# Patient Record
Sex: Female | Born: 1940 | Race: White | Hispanic: No | Smoking: Former smoker
Health system: Southern US, Community
[De-identification: ages and names within clinical notes are randomized; demographics above are authoritative.]

## PROBLEM LIST (undated history)

## (undated) DIAGNOSIS — M1990 Unspecified osteoarthritis, unspecified site: Secondary | ICD-10-CM

## (undated) DIAGNOSIS — C140 Malignant neoplasm of pharynx, unspecified: Secondary | ICD-10-CM

## (undated) DIAGNOSIS — J40 Bronchitis, not specified as acute or chronic: Secondary | ICD-10-CM

## (undated) HISTORY — PX: ABDOMINAL HYSTERECTOMY: SHX81

## (undated) HISTORY — PX: THROAT SURGERY: SHX803

---

## 1998-06-12 ENCOUNTER — Other Ambulatory Visit: Admission: RE | Admit: 1998-06-12 | Discharge: 1998-06-12 | Payer: Self-pay | Admitting: Otolaryngology

## 2011-03-21 ENCOUNTER — Emergency Department (INDEPENDENT_AMBULATORY_CARE_PROVIDER_SITE_OTHER): Payer: Medicare Other

## 2011-03-21 ENCOUNTER — Emergency Department (HOSPITAL_BASED_OUTPATIENT_CLINIC_OR_DEPARTMENT_OTHER)
Admission: EM | Admit: 2011-03-21 | Discharge: 2011-03-22 | Disposition: A | Discharge status: Home / Self Care | Payer: Medicare Other | Attending: Emergency Medicine | Admitting: Emergency Medicine

## 2011-03-21 ENCOUNTER — Encounter: Payer: Self-pay | Admitting: *Deleted

## 2011-03-21 DIAGNOSIS — Z79899 Other long term (current) drug therapy: Secondary | ICD-10-CM | POA: Insufficient documentation

## 2011-03-21 DIAGNOSIS — Z85819 Personal history of malignant neoplasm of unspecified site of lip, oral cavity, and pharynx: Secondary | ICD-10-CM

## 2011-03-21 DIAGNOSIS — J45909 Unspecified asthma, uncomplicated: Secondary | ICD-10-CM | POA: Insufficient documentation

## 2011-03-21 DIAGNOSIS — R0602 Shortness of breath: Secondary | ICD-10-CM | POA: Insufficient documentation

## 2011-03-21 DIAGNOSIS — E119 Type 2 diabetes mellitus without complications: Secondary | ICD-10-CM | POA: Insufficient documentation

## 2011-03-21 DIAGNOSIS — J40 Bronchitis, not specified as acute or chronic: Secondary | ICD-10-CM

## 2011-03-21 DIAGNOSIS — Z87891 Personal history of nicotine dependence: Secondary | ICD-10-CM

## 2011-03-21 DIAGNOSIS — R05 Cough: Secondary | ICD-10-CM

## 2011-03-21 HISTORY — DX: Bronchitis, not specified as acute or chronic: J40

## 2011-03-21 HISTORY — DX: Malignant neoplasm of pharynx, unspecified: C14.0

## 2011-03-21 MED ORDER — AZITHROMYCIN 250 MG PO TABS: 250.0000 mg | ORAL_TABLET | Freq: Every day | ORAL | Status: AC

## 2011-03-21 MED ORDER — ALBUTEROL SULFATE HFA 108 (90 BASE) MCG/ACT IN AERS
2.0000 | INHALATION_SPRAY | RESPIRATORY_TRACT | Status: DC | PRN
  Administered 2011-03-21: 2 via RESPIRATORY_TRACT
  Filled 2011-03-21: qty 6.7

## 2011-03-21 MED ORDER — AZITHROMYCIN 250 MG PO TABS
500.0000 mg | ORAL_TABLET | Freq: Once | ORAL | Status: AC
  Administered 2011-03-21: 500 mg via ORAL
  Filled 2011-03-21: qty 2

## 2011-03-21 NOTE — ED Notes (Signed)
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 Pt uses a speaking device to aid in speech. Pt states that she was seen by her MD recently for similar symptoms but states that nothing was done and she was at a meeting tonight and began coughing non stop as came to ED.

## 2011-03-21 NOTE — ED Notes (Signed)
D/c home with rx x 1 for zithromax- inhaler given to pt with instructions for use by RT

## 2011-03-21 NOTE — ED Notes (Signed)
Pt reports cough and sob x 2 weeks- saw pcp last week but no tests were done- states sx are worsening

## 2011-03-21 NOTE — ED Provider Notes (Signed)
History     CSN: [id number] Arrival date & time: 03/21/2011  9:55 PM   First MD Initiated Contact with Patient 03/21/11 2258      Chief Complaint  Patient presents with  . Cough  . Shortness of Breath    (Consider location/radiation/quality/duration/timing/severity/associated sxs/prior treatment) HPI Complains of cough productive for 2 weeks yellow sputum and  mild shortness of breath, no fever no pain, no chills no other complaint no treatment prior to coming here. Saw her primary care Dr. several days ago for same complaint. No treatment rendered. no other associated symptoms. Presents tonight as she had a bad coughing spell this evening. Past Medical History  Diagnosis Date  . Asthma   . Throat cancer   . Bronchitis   . Diabetes mellitus     Past Surgical History  Procedure Date  . Throat surgery   . Abdominal hysterectomy    tracheostomy  No family history on file.  History  Substance Use Topics  . Smoking status: Former Games developer  . Smokeless tobacco: Not on file  . Alcohol Use: No   no drug use  OB History    Grav Para Term Preterm Abortions TAB SAB Ect Mult Living                  Review of Systems  Constitutional: Negative.   HENT: Negative.   Respiratory: Positive for cough and shortness of breath.   Cardiovascular: Negative.   Gastrointestinal: Negative.   Musculoskeletal: Negative.   Skin: Negative.   Neurological: Negative.   Hematological: Negative.   Psychiatric/Behavioral: Negative.   All other systems reviewed and are negative.    Allergies  Gluten and Penicillins  Home Medications   Current Outpatient Rx  Name Route Sig Dispense Refill  . ACAI PO Oral Take 1 tablet by mouth daily.      Marland Kitchen ALFALFA PO Oral Take 1 capsule by mouth daily.      Marland Kitchen VITAMIN C PO Oral Take 2 tablets by mouth daily.      . CHLORTHALIDONE 25 MG PO TABS Oral Take 25 mg by mouth daily.      Marland Kitchen CITALOPRAM HYDROBROMIDE 40 MG PO TABS Oral Take 40 mg by mouth daily.       . GLYBURIDE-METFORMIN 5-500 MG PO TABS Oral Take 1 tablet by mouth 2 (two) times daily.      Marland Kitchen LISINOPRIL 5 MG PO TABS Oral Take 5 mg by mouth every morning.      Marland Kitchen METFORMIN HCL 500 MG PO TABS Oral Take 500 mg by mouth 2 (two) times daily.      Marland Kitchen OMEPRAZOLE 20 MG PO CPDR Oral Take 20 mg by mouth daily.      Marland Kitchen OVER THE COUNTER MEDICATION Oral Take 1 tablet by mouth daily. Vitamin C and D combination     . SIMVASTATIN 40 MG PO TABS Oral Take 40 mg by mouth at bedtime.        BP 126/66  Pulse 74  Temp(Src) 98.4 F (36.9 C) (Oral)  Resp 18  SpO2 96%  Physical Exam  Nursing note and vitals reviewed. Constitutional: She appears well-developed and well-nourished.  HENT:  Head: Normocephalic and atraumatic.  Eyes: Conjunctivae are normal. Pupils are equal, round, and reactive to light.  Neck: Neck supple. No tracheal deviation present. No thyromegaly present.       Tracheostomy  Cardiovascular: Normal rate and regular rhythm.   No murmur heard. Pulmonary/Chest: Effort normal. She has wheezes.  No respiratory distress mild end expiratory wheezes  Abdominal: Soft. Bowel sounds are normal. She exhibits no distension. There is no tenderness.  Musculoskeletal: Normal range of motion. She exhibits no edema and no tenderness.  Neurological: She is alert. Coordination normal.  Skin: Skin is warm and dry. No rash noted.  Psychiatric: She has a normal mood and affect.    ED Course  Procedures (including critical care time)  Labs Reviewed - No data to display Dg Chest 2 View  03/21/2011  *RADIOLOGY REPORT*  Clinical Data: Productive cough and shortness of breath; history of throat cancer and smoking.  CHEST - 2 VIEW  Comparison: None.  Findings: The lungs are well-aerated.  Mild right basilar airspace opacity is not visualized on the lateral view, and most likely reflects atelectasis.  There is no evidence of pleural effusion or pneumothorax.  There is mild apparent chronic narrowing  of the trachea, without evidence of compression.  The heart is normal in size; the mediastinal contour is within normal limits.  No acute osseous abnormalities are seen.  IMPRESSION:  1.  Mild right basilar airspace opacity, not visualized on the lateral view, most likely reflects atelectasis. 2.  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  Original Report Authenticated By: Tonia Ghent, M.D.     No diagnosis found.  Results for orders placed during the hospital encounter of 03/21/11  GLUCOSE, CAPILLARY      Component Value Range   Glucose-Capillary 130 (*) 70 - 99 (mg/dL)   Comment 1 Notify RN     Comment 2 Documented in Chart     Dg Chest 2 View  03/21/2011  *RADIOLOGY REPORT*  Clinical Data: Productive cough and shortness of breath; history of throat cancer and smoking.  CHEST - 2 VIEW  Comparison: None.  Findings: The lungs are well-aerated.  Mild right basilar airspace opacity is not visualized on the lateral view, and most likely reflects atelectasis.  There is no evidence of pleural effusion or pneumothorax.  There is mild apparent chronic narrowing of the trachea, without evidence of compression.  The heart is normal in size; the mediastinal contour is within normal limits.  No acute osseous abnormalities are seen.  IMPRESSION:  1.  Mild right basilar airspace opacity, not visualized on the lateral view, most likely reflects atelectasis. 2.  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  Original Report Authenticated By: Tonia Ghent, M.D.     MDM  cbg =130, normal    Asssesment:in light of sx lasting 2 qweeks will rx zithmax and albuterol hfa Pt to keep appt w/ pmd in 2 days Diagnosis : bronchitis      Doug Sou, MD 03/21/11 2320

## 2011-03-21 NOTE — ED Notes (Signed)
I reported to Dr. Rennis Chris that the blood sugar is 130.

## 2011-03-28 ENCOUNTER — Encounter (HOSPITAL_BASED_OUTPATIENT_CLINIC_OR_DEPARTMENT_OTHER): Payer: Self-pay | Admitting: *Deleted

## 2012-09-15 ENCOUNTER — Emergency Department (HOSPITAL_BASED_OUTPATIENT_CLINIC_OR_DEPARTMENT_OTHER): Payer: Medicare Other

## 2012-09-15 ENCOUNTER — Emergency Department (HOSPITAL_BASED_OUTPATIENT_CLINIC_OR_DEPARTMENT_OTHER)
Admission: EM | Admit: 2012-09-15 | Discharge: 2012-09-15 | Disposition: A | Discharge status: Home Health | Payer: Medicare Other | Attending: Emergency Medicine | Admitting: Emergency Medicine

## 2012-09-15 ENCOUNTER — Encounter (HOSPITAL_BASED_OUTPATIENT_CLINIC_OR_DEPARTMENT_OTHER): Payer: Self-pay | Admitting: *Deleted

## 2012-09-15 DIAGNOSIS — S8000XA Contusion of unspecified knee, initial encounter: Secondary | ICD-10-CM | POA: Insufficient documentation

## 2012-09-15 DIAGNOSIS — Z87891 Personal history of nicotine dependence: Secondary | ICD-10-CM | POA: Insufficient documentation

## 2012-09-15 DIAGNOSIS — S8001XA Contusion of right knee, initial encounter: Secondary | ICD-10-CM

## 2012-09-15 DIAGNOSIS — S0990XA Unspecified injury of head, initial encounter: Secondary | ICD-10-CM | POA: Insufficient documentation

## 2012-09-15 DIAGNOSIS — J45909 Unspecified asthma, uncomplicated: Secondary | ICD-10-CM | POA: Insufficient documentation

## 2012-09-15 DIAGNOSIS — Y9301 Activity, walking, marching and hiking: Secondary | ICD-10-CM | POA: Insufficient documentation

## 2012-09-15 DIAGNOSIS — Y92009 Unspecified place in unspecified non-institutional (private) residence as the place of occurrence of the external cause: Secondary | ICD-10-CM | POA: Insufficient documentation

## 2012-09-15 DIAGNOSIS — S60229A Contusion of unspecified hand, initial encounter: Secondary | ICD-10-CM | POA: Insufficient documentation

## 2012-09-15 DIAGNOSIS — Z880 Allergy status to penicillin: Secondary | ICD-10-CM | POA: Insufficient documentation

## 2012-09-15 DIAGNOSIS — Z79899 Other long term (current) drug therapy: Secondary | ICD-10-CM | POA: Insufficient documentation

## 2012-09-15 DIAGNOSIS — E119 Type 2 diabetes mellitus without complications: Secondary | ICD-10-CM | POA: Insufficient documentation

## 2012-09-15 DIAGNOSIS — S60221A Contusion of right hand, initial encounter: Secondary | ICD-10-CM

## 2012-09-15 DIAGNOSIS — Z85819 Personal history of malignant neoplasm of unspecified site of lip, oral cavity, and pharynx: Secondary | ICD-10-CM | POA: Insufficient documentation

## 2012-09-15 DIAGNOSIS — S1093XA Contusion of unspecified part of neck, initial encounter: Secondary | ICD-10-CM | POA: Insufficient documentation

## 2012-09-15 DIAGNOSIS — S0083XA Contusion of other part of head, initial encounter: Secondary | ICD-10-CM

## 2012-09-15 DIAGNOSIS — S0530XA Ocular laceration without prolapse or loss of intraocular tissue, unspecified eye, initial encounter: Secondary | ICD-10-CM | POA: Insufficient documentation

## 2012-09-15 DIAGNOSIS — S022XXA Fracture of nasal bones, initial encounter for closed fracture: Secondary | ICD-10-CM | POA: Insufficient documentation

## 2012-09-15 DIAGNOSIS — W19XXXA Unspecified fall, initial encounter: Secondary | ICD-10-CM

## 2012-09-15 DIAGNOSIS — W108XXA Fall (on) (from) other stairs and steps, initial encounter: Secondary | ICD-10-CM | POA: Insufficient documentation

## 2012-09-15 DIAGNOSIS — S0003XA Contusion of scalp, initial encounter: Secondary | ICD-10-CM | POA: Insufficient documentation

## 2012-09-15 MED ORDER — HYDROCODONE-ACETAMINOPHEN 5-325 MG PO TABS: 2.0000 | ORAL_TABLET | ORAL | Status: DC | PRN

## 2012-09-15 NOTE — ED Notes (Signed)
Pt states that her shoe caught on carpet and she tripped fell down 2 steps no loss of consciousness complains of pain bridge of nose some dried blood noted around nose but no bleeding noted from the nose. Pt states she is having pain in her right hip,right knee,and right leg,right ankle. No deformity noted of any mentioned.

## 2012-09-15 NOTE — ED Provider Notes (Signed)
History     CSN: [id number]  Arrival date & time 09/15/12  1800   First MD Initiated Contact with Patient 09/15/12 1801      Chief Complaint  Patient presents with  . Fall    shoe caught on floor tripped fell down 2 stairs landed on face nose pain right hip pain right hand pain right knee pain    (Consider location/radiation/quality/duration/timing/severity/associated sxs/prior treatment) HPI Comments: Patient was walking in and out of the house to bring in groceries when she fell down the last two steps and landed on the sidewalk.  She has swelling to the right eye and an abrasion / small laceration in the corner of the eye.  She denies loc, headache, chest pian, shortness of breath.  She complains of headache, face pain, right knee pain, and left hand and wrist pain.    Patient is a 72 y.o. female presenting with fall. The history is provided by the patient.  Fall This is a new problem. The current episode started less than 1 hour ago. The problem occurs constantly. Associated symptoms include headaches. Pertinent negatives include no abdominal pain and no shortness of breath. Nothing aggravates the symptoms. Nothing relieves the symptoms. She has tried nothing for the symptoms.    Past Medical History  Diagnosis Date  . Asthma   . Throat cancer   . Bronchitis   . Diabetes mellitus     Past Surgical History  Procedure Laterality Date  . Throat surgery    . Abdominal hysterectomy      History reviewed. No pertinent family history.  History  Substance Use Topics  . Smoking status: Former Games developer  . Smokeless tobacco: Not on file  . Alcohol Use: No    OB History   Grav Para Term Preterm Abortions TAB SAB Ect Mult Living                  Review of Systems  Respiratory: Negative for shortness of breath.   Gastrointestinal: Negative for abdominal pain.  Neurological: Positive for headaches.  All other systems reviewed and are negative.    Allergies  Gluten and  Penicillins  Home Medications   Current Outpatient Rx  Name  Route  Sig  Dispense  Refill  . metFORMIN (GLUCOPHAGE) 1000 MG tablet   Oral   Take 1,000 mg by mouth 2 (two) times daily with a meal.         . ACAI PO   Oral   Take 1 tablet by mouth daily.           Marland Kitchen ALFALFA PO   Oral   Take 1 capsule by mouth daily.           . Ascorbic Acid (VITAMIN C PO)   Oral   Take 2 tablets by mouth daily.           . chlorthalidone (HYGROTON) 25 MG tablet   Oral   Take 25 mg by mouth daily.           . citalopram (CELEXA) 40 MG tablet   Oral   Take 40 mg by mouth daily.           Marland Kitchen glyBURIDE-metformin (GLUCOVANCE) 5-500 MG per tablet   Oral   Take 1 tablet by mouth 2 (two) times daily.           Marland Kitchen lisinopril (PRINIVIL,ZESTRIL) 5 MG tablet   Oral   Take 5 mg by mouth every morning.           Marland Kitchen  metFORMIN (GLUCOPHAGE) 500 MG tablet   Oral   Take 500 mg by mouth 2 (two) times daily.           Marland Kitchen omeprazole (PRILOSEC) 20 MG capsule   Oral   Take 20 mg by mouth daily.           Marland Kitchen OVER THE COUNTER MEDICATION   Oral   Take 1 tablet by mouth daily. Vitamin C and D combination          . simvastatin (ZOCOR) 40 MG tablet   Oral   Take 40 mg by mouth at bedtime.             BP 121/76  Pulse 76  Temp(Src) 98.3 F (36.8 C) (Oral)  Resp 20  SpO2 93%  Physical Exam  Nursing note and vitals reviewed. Constitutional: She is oriented to person, place, and time. She appears well-developed and well-nourished. No distress.  HENT:  Head: Normocephalic.  Mouth/Throat: Oropharynx is clear and moist.  There is a small laceration, abrasion to the corner of the left eye and bridge of the nose.  It measures less than 0.5 cm.    Eyes: EOM are normal. Pupils are equal, round, and reactive to light.  There is no diplopia with upward gaze.  Neck: Normal range of motion. Neck supple.  Cardiovascular: Normal rate and regular rhythm.   No murmur  heard. Pulmonary/Chest: Effort normal and breath sounds normal. No respiratory distress. She has no wheezes.  Abdominal: Soft. Bowel sounds are normal. She exhibits no distension. There is no tenderness.  Musculoskeletal: Normal range of motion. She exhibits no edema.  Lymphadenopathy:    She has no cervical adenopathy.  Neurological: She is alert and oriented to person, place, and time. No cranial nerve deficit. She exhibits normal muscle tone. Coordination normal.  Skin: Skin is warm and dry. She is not diaphoretic.    ED Course  Procedures (including critical care time)  Labs Reviewed - No data to display No results found.   No diagnosis found.    MDM  The imaging studies reveal only a nasal bone fracture that does not appear to require any sort of intervention.  She will be discharged with pain meds, ice to affected areas, return prn.        Geoffery Lyons, MD 09/15/12 2005

## 2014-02-28 ENCOUNTER — Emergency Department (HOSPITAL_BASED_OUTPATIENT_CLINIC_OR_DEPARTMENT_OTHER)
Admission: EM | Admit: 2014-02-28 | Discharge: 2014-02-28 | Disposition: A | Discharge status: Home / Self Care | Payer: Medicare Other | Attending: Emergency Medicine | Admitting: Emergency Medicine

## 2014-02-28 ENCOUNTER — Emergency Department (HOSPITAL_BASED_OUTPATIENT_CLINIC_OR_DEPARTMENT_OTHER): Payer: Medicare Other

## 2014-02-28 ENCOUNTER — Encounter (HOSPITAL_BASED_OUTPATIENT_CLINIC_OR_DEPARTMENT_OTHER): Payer: Self-pay

## 2014-02-28 DIAGNOSIS — J45909 Unspecified asthma, uncomplicated: Secondary | ICD-10-CM | POA: Insufficient documentation

## 2014-02-28 DIAGNOSIS — E119 Type 2 diabetes mellitus without complications: Secondary | ICD-10-CM | POA: Insufficient documentation

## 2014-02-28 DIAGNOSIS — Z880 Allergy status to penicillin: Secondary | ICD-10-CM | POA: Insufficient documentation

## 2014-02-28 DIAGNOSIS — Z85818 Personal history of malignant neoplasm of other sites of lip, oral cavity, and pharynx: Secondary | ICD-10-CM | POA: Insufficient documentation

## 2014-02-28 DIAGNOSIS — R109 Unspecified abdominal pain: Secondary | ICD-10-CM | POA: Diagnosis present

## 2014-02-28 DIAGNOSIS — Z90710 Acquired absence of both cervix and uterus: Secondary | ICD-10-CM | POA: Insufficient documentation

## 2014-02-28 DIAGNOSIS — K5792 Diverticulitis of intestine, part unspecified, without perforation or abscess without bleeding: Secondary | ICD-10-CM | POA: Diagnosis not present

## 2014-02-28 DIAGNOSIS — Z87891 Personal history of nicotine dependence: Secondary | ICD-10-CM | POA: Diagnosis not present

## 2014-02-28 DIAGNOSIS — Z79899 Other long term (current) drug therapy: Secondary | ICD-10-CM | POA: Insufficient documentation

## 2014-02-28 LAB — COMPREHENSIVE METABOLIC PANEL
ALBUMIN: 3.4 g/dL — AB (ref 3.5–5.2)
ALK PHOS: 68 U/L (ref 39–117)
ALT: 9 U/L (ref 0–35)
ANION GAP: 12 (ref 5–15)
AST: 19 U/L (ref 0–37)
BUN: 11 mg/dL (ref 6–23)
CO2: 25 mEq/L (ref 19–32)
Calcium: 9 mg/dL (ref 8.4–10.5)
Chloride: 102 mEq/L (ref 96–112)
Creatinine, Ser: 0.9 mg/dL (ref 0.50–1.10)
GFR calc Af Amer: 72 mL/min — ABNORMAL LOW (ref >90)
GFR calc non Af Amer: 62 mL/min — ABNORMAL LOW (ref >90)
Glucose, Bld: 141 mg/dL — ABNORMAL HIGH (ref 70–99)
POTASSIUM: 4 meq/L (ref 3.7–5.3)
SODIUM: 139 meq/L (ref 137–147)
TOTAL PROTEIN: 7 g/dL (ref 6.0–8.3)
Total Bilirubin: 0.4 mg/dL (ref 0.3–1.2)

## 2014-02-28 LAB — URINE MICROSCOPIC-ADD ON

## 2014-02-28 LAB — CBC WITH DIFFERENTIAL/PLATELET
Basophils Absolute: 0 10*3/uL (ref 0.0–0.1)
Basophils Relative: 0 % (ref 0–1)
Eosinophils Absolute: 0.2 10*3/uL (ref 0.0–0.7)
Eosinophils Relative: 2 % (ref 0–5)
HCT: 31.8 % — ABNORMAL LOW (ref 36.0–46.0)
HEMOGLOBIN: 10.1 g/dL — AB (ref 12.0–15.0)
LYMPHS ABS: 1 10*3/uL (ref 0.7–4.0)
Lymphocytes Relative: 12 % (ref 12–46)
MCH: 33 pg (ref 26.0–34.0)
MCHC: 31.8 g/dL (ref 30.0–36.0)
MCV: 103.9 fL — ABNORMAL HIGH (ref 78.0–100.0)
MONOS PCT: 10 % (ref 3–12)
Monocytes Absolute: 0.8 10*3/uL (ref 0.1–1.0)
NEUTROS ABS: 5.8 10*3/uL (ref 1.7–7.7)
NEUTROS PCT: 76 % (ref 43–77)
Platelets: 290 10*3/uL (ref 150–400)
RBC: 3.06 MIL/uL — AB (ref 3.87–5.11)
RDW: 13.5 % (ref 11.5–15.5)
WBC: 7.8 10*3/uL (ref 4.0–10.5)

## 2014-02-28 LAB — URINALYSIS, ROUTINE W REFLEX MICROSCOPIC
BILIRUBIN URINE: NEGATIVE
GLUCOSE, UA: NEGATIVE mg/dL
Hgb urine dipstick: NEGATIVE
Ketones, ur: NEGATIVE mg/dL
NITRITE: NEGATIVE
PH: 5.5 (ref 5.0–8.0)
Protein, ur: NEGATIVE mg/dL
SPECIFIC GRAVITY, URINE: 1.01 (ref 1.005–1.030)
Urobilinogen, UA: 0.2 mg/dL (ref 0.0–1.0)

## 2014-02-28 MED ORDER — METRONIDAZOLE 500 MG PO TABS: 500.0000 mg | ORAL_TABLET | Freq: Three times a day (TID) | ORAL | Status: AC

## 2014-02-28 MED ORDER — MORPHINE SULFATE 4 MG/ML IJ SOLN
4.0000 mg | Freq: Once | INTRAMUSCULAR | Status: AC
  Administered 2014-02-28: 4 mg via INTRAVENOUS
  Filled 2014-02-28: qty 1

## 2014-02-28 MED ORDER — CIPROFLOXACIN HCL 500 MG PO TABS: 500.0000 mg | ORAL_TABLET | Freq: Two times a day (BID) | ORAL | Status: AC

## 2014-02-28 MED ORDER — ONDANSETRON HCL 4 MG/2ML IJ SOLN
4.0000 mg | Freq: Once | INTRAMUSCULAR | Status: AC
  Administered 2014-02-28: 4 mg via INTRAVENOUS
  Filled 2014-02-28: qty 2

## 2014-02-28 MED ORDER — HYDROCODONE-ACETAMINOPHEN 5-325 MG PO TABS: 1.0000 | ORAL_TABLET | Freq: Four times a day (QID) | ORAL | Status: AC | PRN

## 2014-02-28 NOTE — ED Notes (Signed)
Pt c/o lt flank pain since midnight with mid vomiting; denies urinary difficulties

## 2014-02-28 NOTE — ED Provider Notes (Signed)
CSN: [id number]     Arrival date & time 02/28/14  0549 History   First MD Initiated Contact with Patient 02/28/14 0601     Chief Complaint  Patient presents with  . Flank Pain     (Consider location/radiation/quality/duration/timing/severity/associated sxs/prior Treatment) HPI Comments: Patient is a 73 year old female with history of diabetes, hypertension, throat cancer with laryngectomy. She presents with complaints of left flank and left sided abdominal pain which started at 12 midnight. She denies any fevers or chills. She does admit to some nausea and vomiting. She denies any constipation, diarrhea, or bloody stool. She denies any urinary complaints. She has history of hysterectomy in the past and tells me her most recent colonoscopy was one year ago and was normal.  Patient is a 73 y.o. female presenting with flank pain. The history is provided by the patient.  Flank Pain This is a new problem. Episode onset: 6 hours ago. The problem occurs constantly. The problem has been rapidly worsening. Associated symptoms include abdominal pain. Nothing aggravates the symptoms. Nothing relieves the symptoms. She has tried nothing for the symptoms. The treatment provided no relief.    Past Medical History  Diagnosis Date  . Asthma   . Throat cancer   . Bronchitis   . Diabetes mellitus    Past Surgical History  Procedure Laterality Date  . Throat surgery    . Abdominal hysterectomy     No family history on file. History  Substance Use Topics  . Smoking status: Former Research scientist (life sciences)  . Smokeless tobacco: Not on file  . Alcohol Use: No   OB History    No data available     Review of Systems  Gastrointestinal: Positive for abdominal pain.  Genitourinary: Positive for flank pain.  All other systems reviewed and are negative.     Allergies  Gluten and Penicillins  Home Medications   Prior to Admission medications   Medication Sig Start Date End Date Taking? Authorizing Provider   ACAI PO Take 1 tablet by mouth daily.      Historical Provider, MD  ALFALFA PO Take 1 capsule by mouth daily.      Historical Provider, MD  Ascorbic Acid (VITAMIN C PO) Take 2 tablets by mouth daily.      Historical Provider, MD  chlorthalidone (HYGROTON) 25 MG tablet Take 25 mg by mouth daily.      Historical Provider, MD  citalopram (CELEXA) 40 MG tablet Take 40 mg by mouth daily.      Historical Provider, MD  glyBURIDE-metformin (GLUCOVANCE) 5-500 MG per tablet Take 1 tablet by mouth 2 (two) times daily.      Historical Provider, MD  HYDROcodone-acetaminophen (NORCO/VICODIN) 5-325 MG per tablet Take 2 tablets by mouth every 4 (four) hours as needed for pain. 09/15/12   Veryl Speak, MD  lisinopril (PRINIVIL,ZESTRIL) 5 MG tablet Take 5 mg by mouth every morning.      Historical Provider, MD  metFORMIN (GLUCOPHAGE) 1000 MG tablet Take 1,000 mg by mouth 2 (two) times daily with a meal.    Historical Provider, MD  metFORMIN (GLUCOPHAGE) 500 MG tablet Take 500 mg by mouth 2 (two) times daily.      Historical Provider, MD  omeprazole (PRILOSEC) 20 MG capsule Take 20 mg by mouth daily.      Historical Provider, MD  OVER THE COUNTER MEDICATION Take 1 tablet by mouth daily. Vitamin C and D combination     Historical Provider, MD  simvastatin (ZOCOR) 40 MG tablet  Take 40 mg by mouth at bedtime.      Historical Provider, MD   BP 127/64 mmHg  Pulse 88  Temp(Src) 98.9 F (37.2 C) (Oral)  Resp 22  Wt 194 lb (87.998 kg)  SpO2 96% Physical Exam  Constitutional: She is oriented to person, place, and time. She appears well-developed and well-nourished. No distress.  HENT:  Head: Normocephalic and atraumatic.  Neck: Normal range of motion. Neck supple.  Cardiovascular: Normal rate and regular rhythm.  Exam reveals no gallop and no friction rub.   No murmur heard. Pulmonary/Chest: Effort normal and breath sounds normal. No respiratory distress. She has no wheezes.  Abdominal: Soft. Bowel sounds are  normal. She exhibits no distension. There is tenderness. There is no rebound and no guarding.  There is tenderness to palpation in the left lower quadrant and left flank.  Musculoskeletal: Normal range of motion.  Neurological: She is alert and oriented to person, place, and time.  Skin: Skin is warm and dry. She is not diaphoretic.  Nursing note and vitals reviewed.   ED Course  Procedures (including critical care time) Labs Review Labs Reviewed  COMPREHENSIVE METABOLIC PANEL  URINALYSIS, ROUTINE W REFLEX MICROSCOPIC  CBC WITH DIFFERENTIAL    Imaging Review No results found.   EKG Interpretation None      MDM   Final diagnoses:  None    CT scan reveals acute, uncomplicated diverticulitis. She will be treated with Cipro, Flagyl, and pain medication. She is to follow-up with her doctor in the next week, and return to the ER she develops worsening pain, high fever, bleeding, or other new and concerning symptoms.    Veryl Speak, MD 02/28/14 [phone]

## 2014-02-28 NOTE — Discharge Instructions (Signed)
Cipro and Flagyl as prescribed.  Hydrocodone as needed for pain.  Return to the emergency department if you develop high fever, worsening pain, bloody stools, or other new and concerning symptoms.   Diverticulitis Diverticulitis is inflammation or infection of small pouches in your colon that form when you have a condition called diverticulosis. The pouches in your colon are called diverticula. Your colon, or large intestine, is where water is absorbed and stool is formed. Complications of diverticulitis can include:  Bleeding.  Severe infection.  Severe pain.  Perforation of your colon.  Obstruction of your colon. CAUSES  Diverticulitis is caused by bacteria. Diverticulitis happens when stool becomes trapped in diverticula. This allows bacteria to grow in the diverticula, which can lead to inflammation and infection. RISK FACTORS People with diverticulosis are at risk for diverticulitis. Eating a diet that does not include enough fiber from fruits and vegetables may make diverticulitis more likely to develop. SYMPTOMS  Symptoms of diverticulitis may include:  Abdominal pain and tenderness. The pain is normally located on the left side of the abdomen, but may occur in other areas.  Fever and chills.  Bloating.  Cramping.  Nausea.  Vomiting.  Constipation.  Diarrhea.  Blood in your stool. DIAGNOSIS  Your health care provider will ask you about your medical history and do a physical exam. You may need to have tests done because many medical conditions can cause the same symptoms as diverticulitis. Tests may include:  Blood tests.  Urine tests.  Imaging tests of the abdomen, including X-rays and CT scans. When your condition is under control, your health care provider may recommend that you have a colonoscopy. A colonoscopy can show how severe your diverticula are and whether something else is causing your symptoms. TREATMENT  Most cases of diverticulitis are mild  and can be treated at home. Treatment may include:  Taking over-the-counter pain medicines.  Following a clear liquid diet.  Taking antibiotic medicines by mouth for 7-10 days. More severe cases may be treated at a hospital. Treatment may include:  Not eating or drinking.  Taking prescription pain medicine.  Receiving antibiotic medicines through an IV tube.  Receiving fluids and nutrition through an IV tube.  Surgery. HOME CARE INSTRUCTIONS   Follow your health care provider's instructions carefully.  Follow a full liquid diet or other diet as directed by your health care provider. After your symptoms improve, your health care provider may tell you to change your diet. He or she may recommend you eat a high-fiber diet. Fruits and vegetables are good sources of fiber. Fiber makes it easier to pass stool.  Take fiber supplements or probiotics as directed by your health care provider.  Only take medicines as directed by your health care provider.  Keep all your follow-up appointments. SEEK MEDICAL CARE IF:   Your pain does not improve.  You have a hard time eating food.  Your bowel movements do not return to normal. SEEK IMMEDIATE MEDICAL CARE IF:   Your pain becomes worse.  Your symptoms do not get better.  Your symptoms suddenly get worse.  You have a fever.  You have repeated vomiting.  You have bloody or black, tarry stools. MAKE SURE YOU:   Understand these instructions.  Will watch your condition.  Will get help right away if you are not doing well or get worse. Document Released: 01/13/2005 Document Revised: 04/10/2013 Document Reviewed: 02/28/2013 [facility] Patient Information 2015 [place], [place]. This information is not intended to replace advice given  to you by your health care provider. Make sure you discuss any questions you have with your health care provider.

## 2014-03-02 ENCOUNTER — Encounter (HOSPITAL_BASED_OUTPATIENT_CLINIC_OR_DEPARTMENT_OTHER): Payer: Self-pay | Admitting: *Deleted

## 2014-03-02 ENCOUNTER — Emergency Department (HOSPITAL_BASED_OUTPATIENT_CLINIC_OR_DEPARTMENT_OTHER)
Admission: EM | Admit: 2014-03-02 | Discharge: 2014-03-03 | Disposition: A | Discharge status: Home / Self Care | Payer: Medicare Other | Attending: Emergency Medicine | Admitting: Emergency Medicine

## 2014-03-02 ENCOUNTER — Emergency Department (HOSPITAL_BASED_OUTPATIENT_CLINIC_OR_DEPARTMENT_OTHER): Payer: Medicare Other

## 2014-03-02 DIAGNOSIS — R600 Localized edema: Secondary | ICD-10-CM | POA: Insufficient documentation

## 2014-03-02 DIAGNOSIS — Z792 Long term (current) use of antibiotics: Secondary | ICD-10-CM | POA: Diagnosis not present

## 2014-03-02 DIAGNOSIS — M7989 Other specified soft tissue disorders: Secondary | ICD-10-CM

## 2014-03-02 DIAGNOSIS — E119 Type 2 diabetes mellitus without complications: Secondary | ICD-10-CM | POA: Insufficient documentation

## 2014-03-02 DIAGNOSIS — Z87891 Personal history of nicotine dependence: Secondary | ICD-10-CM | POA: Insufficient documentation

## 2014-03-02 DIAGNOSIS — J028 Acute pharyngitis due to other specified organisms: Secondary | ICD-10-CM

## 2014-03-02 DIAGNOSIS — J029 Acute pharyngitis, unspecified: Secondary | ICD-10-CM | POA: Insufficient documentation

## 2014-03-02 DIAGNOSIS — J45909 Unspecified asthma, uncomplicated: Secondary | ICD-10-CM | POA: Insufficient documentation

## 2014-03-02 DIAGNOSIS — Z79899 Other long term (current) drug therapy: Secondary | ICD-10-CM | POA: Diagnosis not present

## 2014-03-02 DIAGNOSIS — Z880 Allergy status to penicillin: Secondary | ICD-10-CM | POA: Diagnosis not present

## 2014-03-02 DIAGNOSIS — R609 Edema, unspecified: Secondary | ICD-10-CM

## 2014-03-02 DIAGNOSIS — Z85818 Personal history of malignant neoplasm of other sites of lip, oral cavity, and pharynx: Secondary | ICD-10-CM | POA: Diagnosis not present

## 2014-03-02 DIAGNOSIS — B9789 Other viral agents as the cause of diseases classified elsewhere: Secondary | ICD-10-CM

## 2014-03-02 DIAGNOSIS — R2241 Localized swelling, mass and lump, right lower limb: Secondary | ICD-10-CM | POA: Diagnosis present

## 2014-03-02 NOTE — ED Provider Notes (Signed)
CSN: [id number]     Arrival date & time 03/02/14  7619 History  This chart was scribed for Julianne Rice, MD by Peyton Bottoms, ED Scribe. This patient was seen in room MH05/MH05 and the patient's care was started at 11:13 PM.   Chief Complaint  Patient presents with  . Joint Swelling   Patient is a 73 y.o. female presenting with ankle pain. The history is provided by the patient. No language interpreter was used.  Ankle Pain Location:  Ankle Ankle location:  R ankle Pain details:    Quality:  Aching (swelling)   Radiates to:  Does not radiate   Severity:  Mild   Onset quality:  Gradual   Timing:  Constant Chronicity:  New Dislocation: no   Associated symptoms: no fever and no neck pain    HPI Comments: Donna Mendez is a 73 y.o. female who presents to the Emergency Department complaining of joint swelling with right ankle. No known trauma. No fever or chills. No redness. No recent extended travel or surgeries. She also reports sore throat especially when coughing. Patient has a tracheostomy. Patient was recently diagnosed with diverticulitis a few days ago. Patient denies previous history of blood clots. States she's been taking her antibiotics as prescribed. Denies abdominal pain at this time. No nausea or diarrhea.  Past Medical History  Diagnosis Date  . Asthma   . Throat cancer   . Bronchitis   . Diabetes mellitus    Past Surgical History  Procedure Laterality Date  . Throat surgery    . Abdominal hysterectomy     No family history on file. History  Substance Use Topics  . Smoking status: Former Research scientist (life sciences)  . Smokeless tobacco: Not on file  . Alcohol Use: No   OB History    No data available     Review of Systems  Constitutional: Negative for fever and chills.  HENT: Positive for sore throat.   Respiratory: Positive for cough. Negative for shortness of breath.   Cardiovascular: Positive for leg swelling.  Gastrointestinal: Negative for nausea, vomiting,  abdominal pain and diarrhea.  Musculoskeletal: Positive for joint swelling. Negative for neck pain and neck stiffness.  Skin: Negative for rash and wound.  Neurological: Negative for dizziness, weakness, light-headedness, numbness and headaches.  All other systems reviewed and are negative.  Allergies  Gluten and Penicillins  Home Medications   Prior to Admission medications   Medication Sig Start Date End Date Taking? Authorizing Provider  ACAI PO Take 1 tablet by mouth daily.      Historical Provider, MD  ALFALFA PO Take 1 capsule by mouth daily.      Historical Provider, MD  Ascorbic Acid (VITAMIN C PO) Take 2 tablets by mouth daily.      Historical Provider, MD  chlorthalidone (HYGROTON) 25 MG tablet Take 25 mg by mouth daily.      Historical Provider, MD  ciprofloxacin (CIPRO) 500 MG tablet Take 1 tablet (500 mg total) by mouth 2 (two) times daily. One po bid x 7 days 02/28/14   Veryl Speak, MD  citalopram (CELEXA) 40 MG tablet Take 40 mg by mouth daily.      Historical Provider, MD  glyBURIDE-metformin (GLUCOVANCE) 5-500 MG per tablet Take 1 tablet by mouth 2 (two) times daily.      Historical Provider, MD  HYDROcodone-acetaminophen (NORCO) 5-325 MG per tablet Take 1-2 tablets by mouth every 6 (six) hours as needed. 02/28/14   Veryl Speak, MD  lisinopril (PRINIVIL,ZESTRIL)  5 MG tablet Take 5 mg by mouth every morning.      Historical Provider, MD  metFORMIN (GLUCOPHAGE) 1000 MG tablet Take 1,000 mg by mouth 2 (two) times daily with a meal.    Historical Provider, MD  metFORMIN (GLUCOPHAGE) 500 MG tablet Take 500 mg by mouth 2 (two) times daily.      Historical Provider, MD  metroNIDAZOLE (FLAGYL) 500 MG tablet Take 1 tablet (500 mg total) by mouth 3 (three) times daily. One po bid x 7 days 02/28/14   Veryl Speak, MD  omeprazole (PRILOSEC) 20 MG capsule Take 20 mg by mouth daily.      Historical Provider, MD  OVER THE COUNTER MEDICATION Take 1 tablet by mouth daily. Vitamin C and D  combination     Historical Provider, MD  simvastatin (ZOCOR) 40 MG tablet Take 40 mg by mouth at bedtime.      Historical Provider, MD   Triage Vitals: BP 126/106 mmHg  Pulse 90  Temp(Src) 99.2 F (37.3 C) (Oral)  Resp 16  SpO2 96%  Physical Exam  Constitutional: She is oriented to person, place, and time. She appears well-developed and well-nourished. No distress.  HENT:  Head: Normocephalic and atraumatic.  Mouth/Throat: Oropharynx is clear and moist.  Oropharynx with mild erythema. No tonsillar exudates.  Eyes: EOM are normal. Pupils are equal, round, and reactive to light.  Neck: Normal range of motion. Neck supple.  Cardiovascular: Normal rate and regular rhythm.   Pulmonary/Chest: Effort normal and breath sounds normal. No respiratory distress. She has no wheezes. She has no rales. She exhibits no tenderness.  Abdominal: Soft. Bowel sounds are normal. She exhibits no distension and no mass. There is no tenderness. There is no rebound and no guarding.  Musculoskeletal: Normal range of motion. She exhibits edema and tenderness.  Patient with left ankle edema. 2+. Extends into the lower calf area. Mild tenderness to palpation. No warmth or erythema. Distal pulses intact.  Neurological: She is alert and oriented to person, place, and time.  Moves all extremities without deficit. Sensation is intact.  Skin: Skin is warm and dry. No rash noted. No erythema.  Psychiatric: She has a normal mood and affect. Her behavior is normal.  Nursing note and vitals reviewed.   ED Course  Procedures (including critical care time)  DIAGNOSTIC STUDIES: Oxygen Saturation is 96% on RA, normal by my interpretation.    COORDINATION OF CARE: 11:24 PM- Discussed plans to obtain diagnostic imaging of right ankle. Pt advised of plan for treatment and pt agrees.  Labs Review Labs Reviewed - No data to display  Imaging Review Dg Ankle Complete Right  03/02/2014   CLINICAL DATA:  Right ankle pain  and swelling following fall  EXAM: RIGHT ANKLE - COMPLETE 3+ VIEW  COMPARISON:  12/28/2013 and 11/23/2011  FINDINGS: Diffuse soft tissue swelling is noted.  There is no evidence of acute fracture, subluxation or dislocation.  No focal bony lesions are noted except for small calcaneal spur.  There been no bony changes since 2013.  IMPRESSION: Soft tissue swelling without acute bony abnormality.   Electronically Signed   By: Hassan Rowan M.D.   On: 03/02/2014 19:47    EKG Interpretation None     MDM   Final diagnoses:  Swelling      I personally performed the services described in this documentation, which was scribed in my presence. The recorded information has been reviewed and is accurate.  She presents with unilateral lower  extremity edema. We'll schedule for Doppler of the right leg in the morning. lungs are clear. Patient is satting high 90s on room air. She has mild swelling throat with coughing. Likely URI. Abdomen is soft and nontender. I do not believe repeat imaging is necessary.  Julianne Rice, MD 03/03/14 0040

## 2014-03-02 NOTE — ED Notes (Signed)
Pt presents with right ankle edema, sore throat, diverticulitis flare - pt is a trach patient.

## 2014-03-03 ENCOUNTER — Other Ambulatory Visit (HOSPITAL_BASED_OUTPATIENT_CLINIC_OR_DEPARTMENT_OTHER): Payer: Self-pay | Admitting: Emergency Medicine

## 2014-03-03 ENCOUNTER — Ambulatory Visit (HOSPITAL_BASED_OUTPATIENT_CLINIC_OR_DEPARTMENT_OTHER)
Admission: EM | Admit: 2014-03-03 | Discharge: 2014-03-03 | Disposition: A | Discharge status: Home / Self Care | Payer: Medicare Other | Source: Home / Self Care | Attending: Emergency Medicine | Admitting: Emergency Medicine

## 2014-03-03 ENCOUNTER — Other Ambulatory Visit (HOSPITAL_BASED_OUTPATIENT_CLINIC_OR_DEPARTMENT_OTHER): Payer: Medicare Other

## 2014-03-03 DIAGNOSIS — M7989 Other specified soft tissue disorders: Secondary | ICD-10-CM

## 2014-03-03 DIAGNOSIS — R600 Localized edema: Secondary | ICD-10-CM | POA: Diagnosis not present

## 2014-03-03 NOTE — Discharge Instructions (Signed)
Edema °Edema is an abnormal buildup of fluids in your body tissues. Edema is somewhat dependent on gravity to pull the fluid to the lowest place in your body. That makes the condition more common in the legs and thighs (lower extremities). Painless swelling of the feet and ankles is common and becomes more likely as you get older. It is also common in looser tissues, like around your eyes.  °When the affected area is squeezed, the fluid may move out of that spot and leave a dent for a few moments. This dent is called pitting.  °CAUSES  °There are many possible causes of edema. Eating too much salt and being on your feet or sitting for a long time can cause edema in your legs and ankles. Hot weather may make edema worse. Common medical causes of edema include: °· Heart failure. °· Liver disease. °· Kidney disease. °· Weak blood vessels in your legs. °· Cancer. °· An injury. °· Pregnancy. °· Some medications. °· Obesity.  °SYMPTOMS  °Edema is usually painless. Your skin may look swollen or shiny.  °DIAGNOSIS  °Your health care provider may be able to diagnose edema by asking about your medical history and doing a physical exam. You may need to have tests such as X-rays, an electrocardiogram, or blood tests to check for medical conditions that may cause edema.  °TREATMENT  °Edema treatment depends on the cause. If you have heart, liver, or kidney disease, you need the treatment appropriate for these conditions. General treatment may include: °· Elevation of the affected body part above the level of your heart. °· Compression of the affected body part. Pressure from elastic bandages or support stockings squeezes the tissues and forces fluid back into the blood vessels. This keeps fluid from entering the tissues. °· Restriction of fluid and salt intake. °· Use of a water pill (diuretic). These medications are appropriate only for some types of edema. They pull fluid out of your body and make you urinate more often. This  gets rid of fluid and reduces swelling, but diuretics can have side effects. Only use diuretics as directed by your health care provider. °HOME CARE INSTRUCTIONS  °· Keep the affected body part above the level of your heart when you are lying down.   °· Do not sit still or stand for prolonged periods.   °· Do not put anything directly under your knees when lying down. °· Do not wear constricting clothing or garters on your upper legs.   °· Exercise your legs to work the fluid back into your blood vessels. This may help the swelling go down.   °· Wear elastic bandages or support stockings to reduce ankle swelling as directed by your health care provider.   °· Eat a low-salt diet to reduce fluid if your health care provider recommends it.   °· Only take medicines as directed by your health care provider.  °SEEK MEDICAL CARE IF:  °· Your edema is not responding to treatment. °· You have heart, liver, or kidney disease and notice symptoms of edema. °· You have edema in your legs that does not improve after elevating them.   °· You have sudden and unexplained weight gain. °SEEK IMMEDIATE MEDICAL CARE IF:  °· You develop shortness of breath or chest pain.   °· You cannot breathe when you lie down. °· You develop pain, redness, or warmth in the swollen areas.   °· You have heart, liver, or kidney disease and suddenly get edema. °· You have a fever and your symptoms suddenly get worse. °MAKE SURE YOU:  °·   Understand these instructions.  Will watch your condition.  Will get help right away if you are not doing well or get worse. Document Released: 04/05/2005 Document Revised: 08/20/2013 Document Reviewed: 01/26/2013 [facility] Patient Information 2015 [place], [place]. This information is not intended to replace advice given to you by your health care provider. Make sure you discuss any questions you have with your health care provider.  Sore Throat A sore throat is pain, burning, irritation, or scratchiness of the  throat. There is often pain or tenderness when swallowing or talking. A sore throat may be accompanied by other symptoms, such as coughing, sneezing, fever, and swollen neck glands. A sore throat is often the first sign of another sickness, such as a cold, flu, strep throat, or mononucleosis (commonly known as mono). Most sore throats go away without medical treatment. CAUSES  The most common causes of a sore throat include:  A viral infection, such as a cold, flu, or mono.  A bacterial infection, such as strep throat, tonsillitis, or whooping cough.  Seasonal allergies.  Dryness in the air.  Irritants, such as smoke or pollution.  Gastroesophageal reflux disease (GERD). HOME CARE INSTRUCTIONS   Only take over-the-counter medicines as directed by your caregiver.  Drink enough fluids to keep your urine clear or pale yellow.  Rest as needed.  Try using throat sprays, lozenges, or sucking on hard candy to ease any pain (if older than 4 years or as directed).  Sip warm liquids, such as broth, herbal tea, or warm water with honey to relieve pain temporarily. You may also eat or drink cold or frozen liquids such as frozen ice pops.  Gargle with salt water (mix 1 tsp salt with 8 oz of water).  Do not smoke and avoid secondhand smoke.  Put a cool-mist humidifier in your bedroom at night to moisten the air. You can also turn on a hot shower and sit in the bathroom with the door closed for 5-10 minutes. SEEK IMMEDIATE MEDICAL CARE IF:  You have difficulty breathing.  You are unable to swallow fluids, soft foods, or your saliva.  You have increased swelling in the throat.  Your sore throat does not get better in 7 days.  You have nausea and vomiting.  You have a fever or persistent symptoms for more than 2-3 days.  You have a fever and your symptoms suddenly get worse. MAKE SURE YOU:   Understand these instructions.  Will watch your condition.  Will get help right away if  you are not doing well or get worse. Document Released: 05/13/2004 Document Revised: 03/22/2012 Document Reviewed: 12/12/2011 [facility] Patient Information 2015 [place], [place]. This information is not intended to replace advice given to you by your health care provider. Make sure you discuss any questions you have with your health care provider.

## 2015-08-16 ENCOUNTER — Emergency Department (HOSPITAL_BASED_OUTPATIENT_CLINIC_OR_DEPARTMENT_OTHER)
Admission: EM | Admit: 2015-08-16 | Discharge: 2015-08-17 | Disposition: A | Discharge status: Home / Self Care | Payer: Medicare Other | Attending: Emergency Medicine | Admitting: Emergency Medicine

## 2015-08-16 ENCOUNTER — Encounter (HOSPITAL_BASED_OUTPATIENT_CLINIC_OR_DEPARTMENT_OTHER): Payer: Self-pay | Admitting: Emergency Medicine

## 2015-08-16 DIAGNOSIS — Z7984 Long term (current) use of oral hypoglycemic drugs: Secondary | ICD-10-CM | POA: Insufficient documentation

## 2015-08-16 DIAGNOSIS — J45909 Unspecified asthma, uncomplicated: Secondary | ICD-10-CM | POA: Diagnosis not present

## 2015-08-16 DIAGNOSIS — M79631 Pain in right forearm: Secondary | ICD-10-CM | POA: Insufficient documentation

## 2015-08-16 DIAGNOSIS — M1990 Unspecified osteoarthritis, unspecified site: Secondary | ICD-10-CM | POA: Diagnosis not present

## 2015-08-16 DIAGNOSIS — R609 Edema, unspecified: Secondary | ICD-10-CM | POA: Diagnosis present

## 2015-08-16 DIAGNOSIS — M79632 Pain in left forearm: Secondary | ICD-10-CM | POA: Insufficient documentation

## 2015-08-16 DIAGNOSIS — Z87891 Personal history of nicotine dependence: Secondary | ICD-10-CM | POA: Insufficient documentation

## 2015-08-16 DIAGNOSIS — E119 Type 2 diabetes mellitus without complications: Secondary | ICD-10-CM | POA: Insufficient documentation

## 2015-08-16 DIAGNOSIS — Z5321 Procedure and treatment not carried out due to patient leaving prior to being seen by health care provider: Secondary | ICD-10-CM | POA: Diagnosis not present

## 2015-08-16 HISTORY — DX: Unspecified osteoarthritis, unspecified site: M19.90

## 2015-08-16 NOTE — ED Notes (Signed)
Pt came in via [facility], after mechanical fall yesterday outside her home. C/o BIL LE edema. States some edema is baseline but not the amount she has now. Also reports BIL upper forearm pain. Denies LOC or head injury, denies syncope, dizziness. Pt alert & oriented on arrival.

## 2015-08-17 NOTE — ED Notes (Signed)
Pt called EMT to room, stated she wanted to be taken home without being seen because she had not seen a provider yet. EDP notified.

## 2016-07-18 DEATH — deceased

## 2016-08-06 IMAGING — CR DG ANKLE COMPLETE 3+V*R*
3 series · 3 of 3 positions shown · non-contrast
Comparison: 12/28/2013 and 11/23/2011

CLINICAL DATA: Right ankle pain and swelling following fall

EXAM:
RIGHT ANKLE - COMPLETE 3+ VIEW

[t ankle joint ap right]
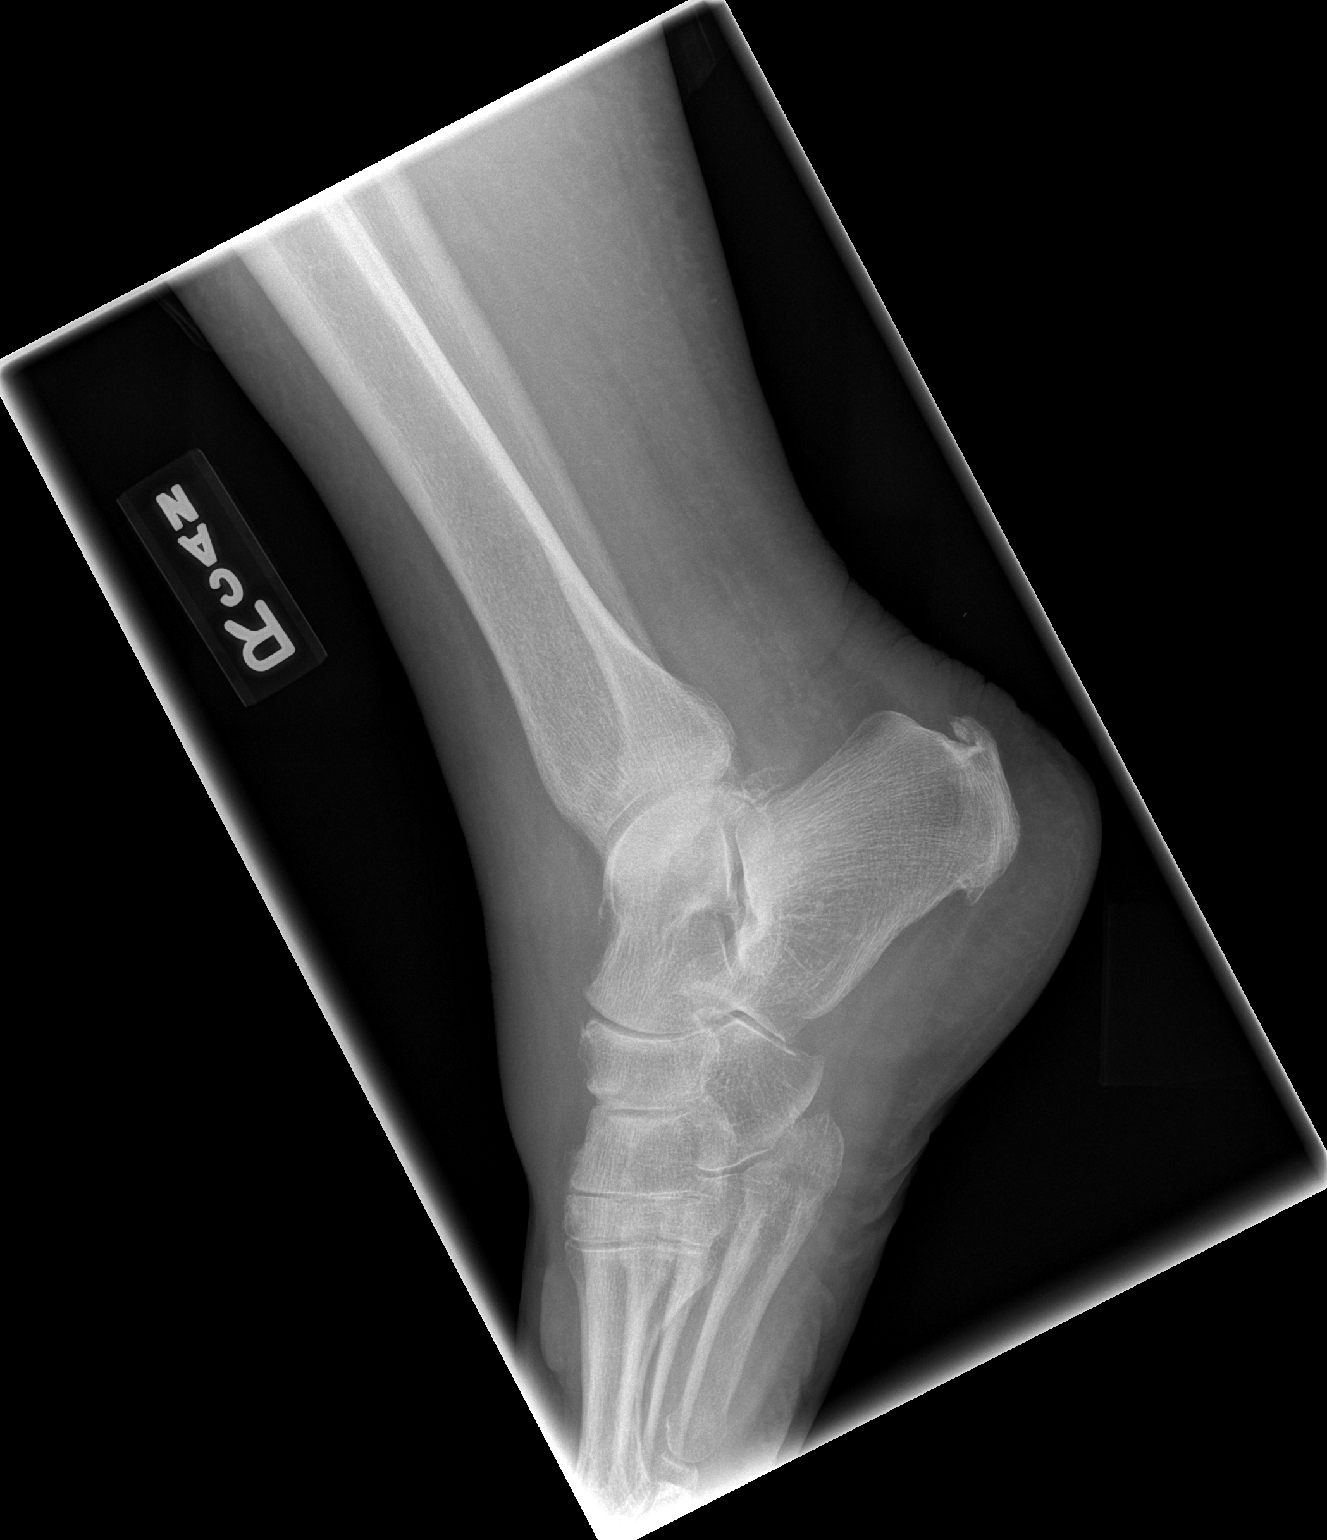

[t ankle joint oblique right]
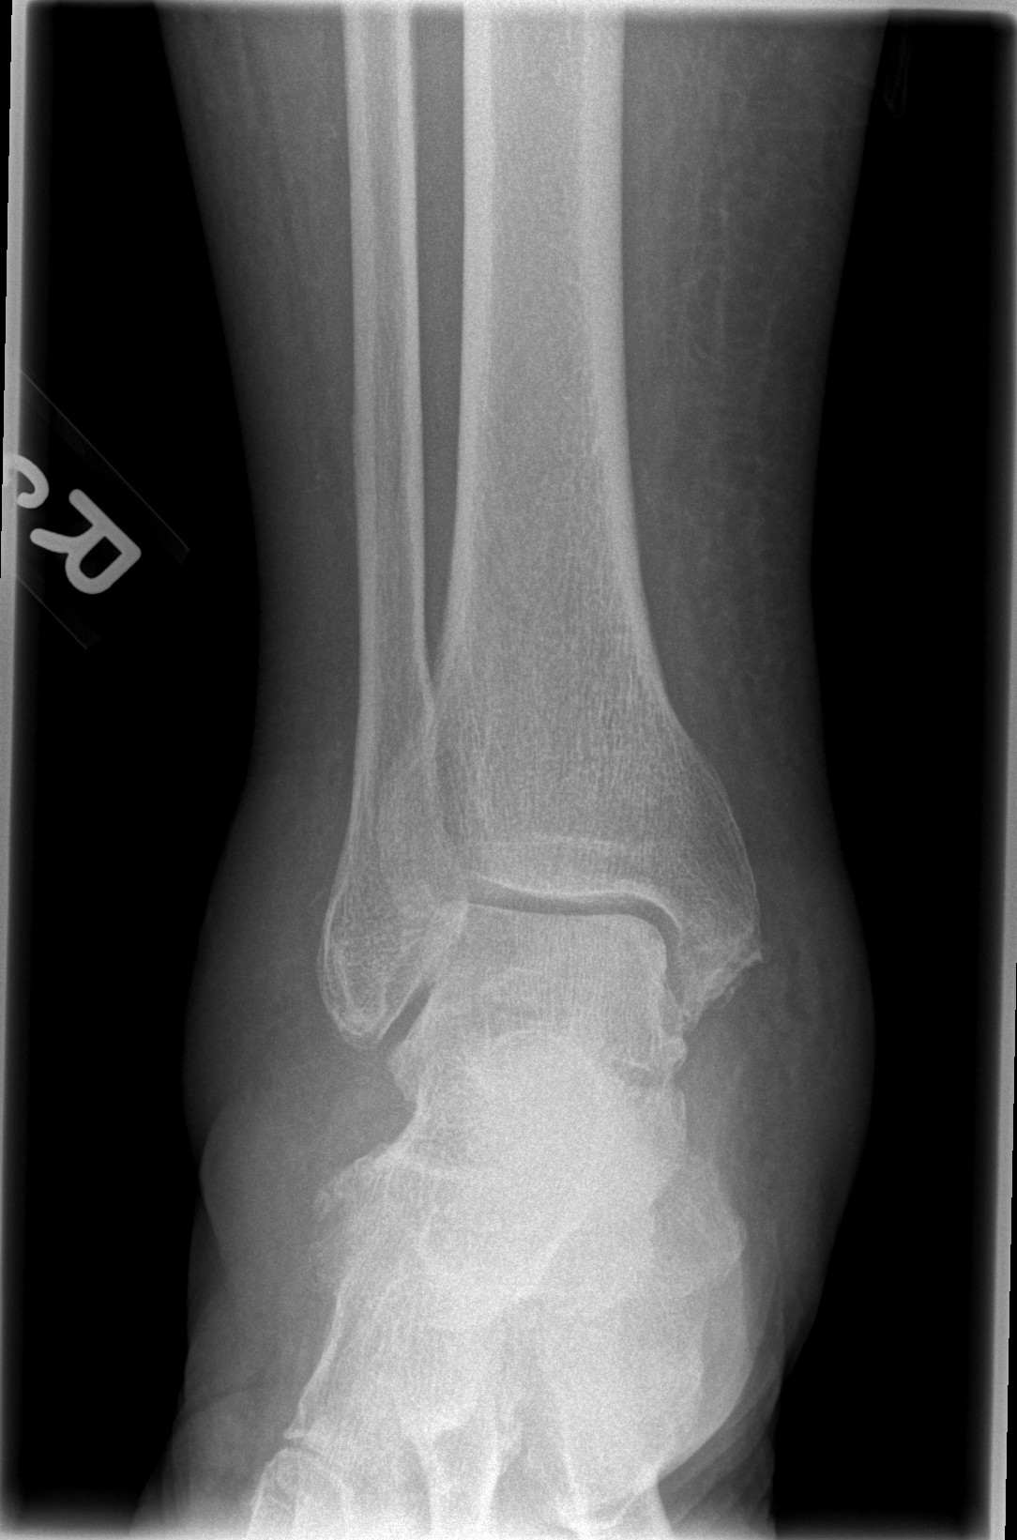

[t ankle joint lat right]
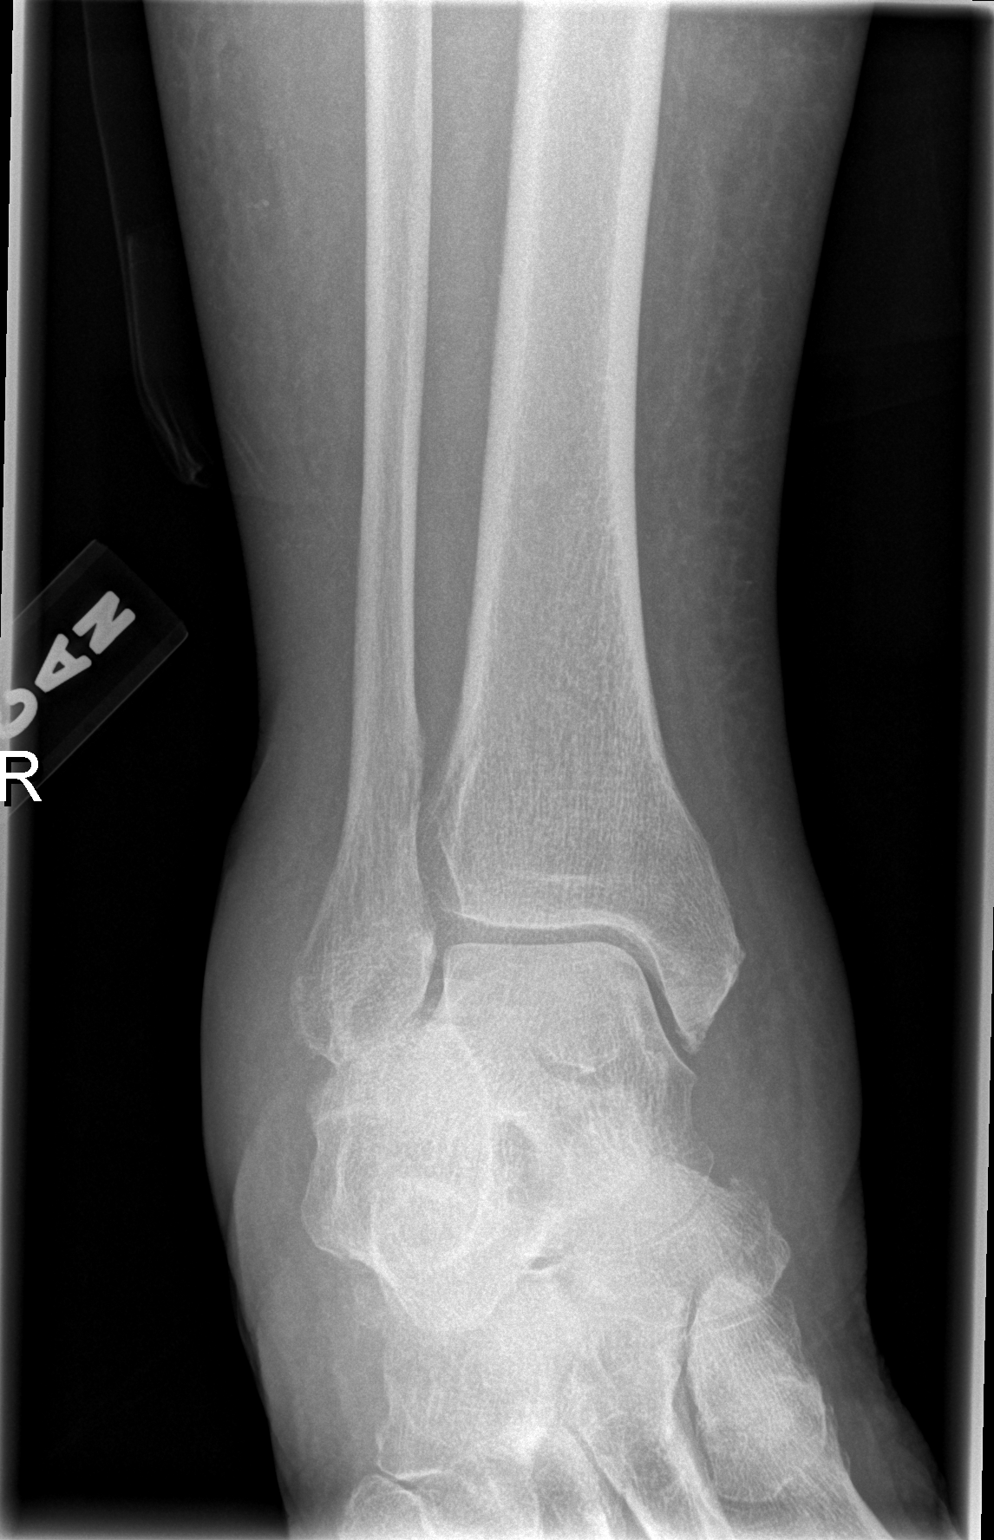

[3 of 3 positions shown; findings below may reference images not displayed]

FINDINGS: Diffuse soft tissue swelling is noted.

There is no evidence of acute fracture, subluxation or dislocation.

No focal bony lesions are noted except for small calcaneal spur.

There been no bony changes since 3730.
IMPRESSION: Soft tissue swelling without acute bony abnormality.

## 2016-08-07 IMAGING — US US EXTREM LOW VENOUS BILAT
1 series · 13 of 24 positions shown · non-contrast
Comparison: None.

CLINICAL DATA: Bilateral lower extremity swelling for 7 months
intermittently.



[Series 1: us extrem low venous bilat · 0.08mm/px · 13 of 72 slices shown]
[im 1/72]
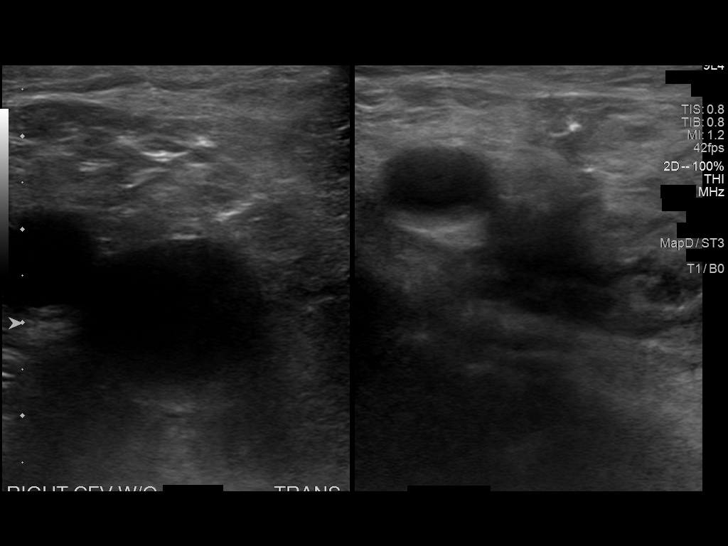
[im 7/72]
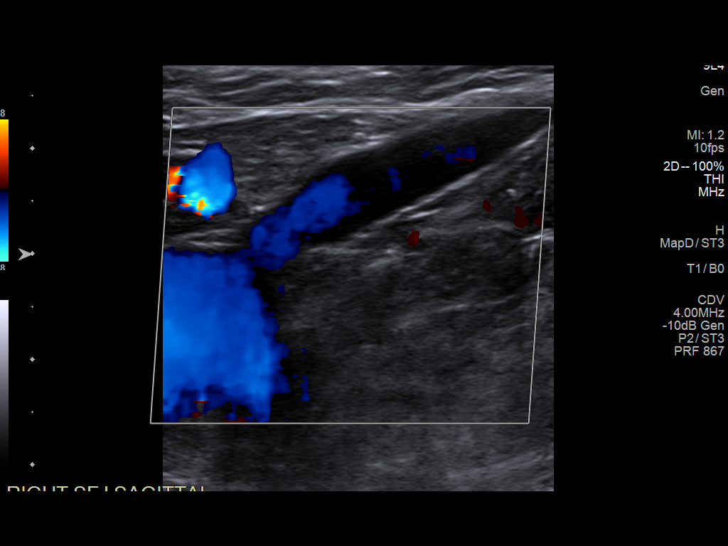
[im 13/72]
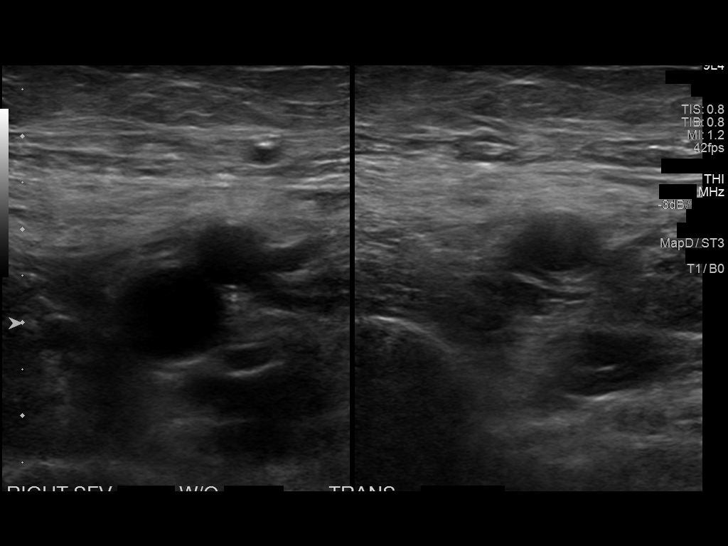
[im 19/72]
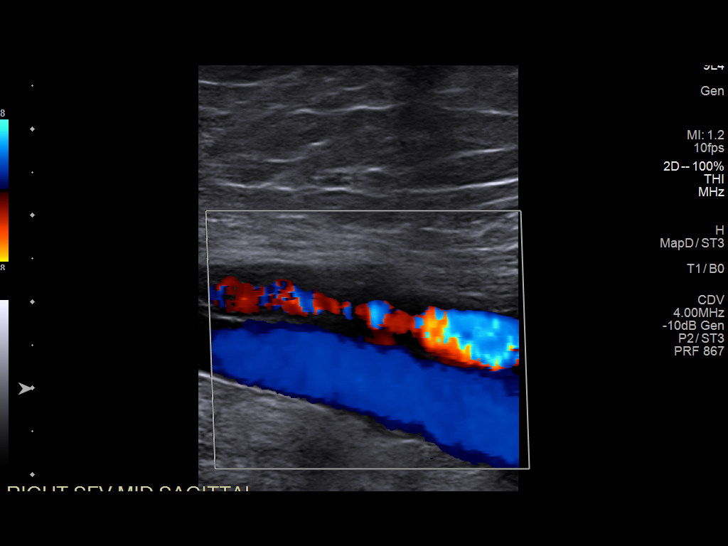
[im 25/72]
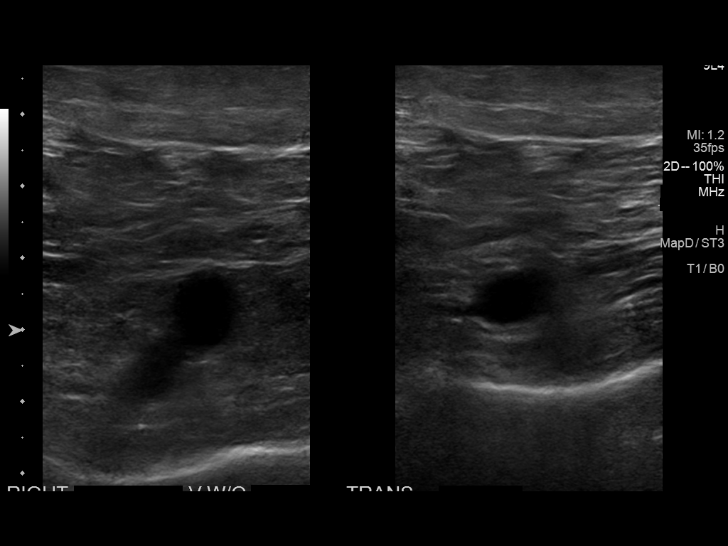
[im 31/72]
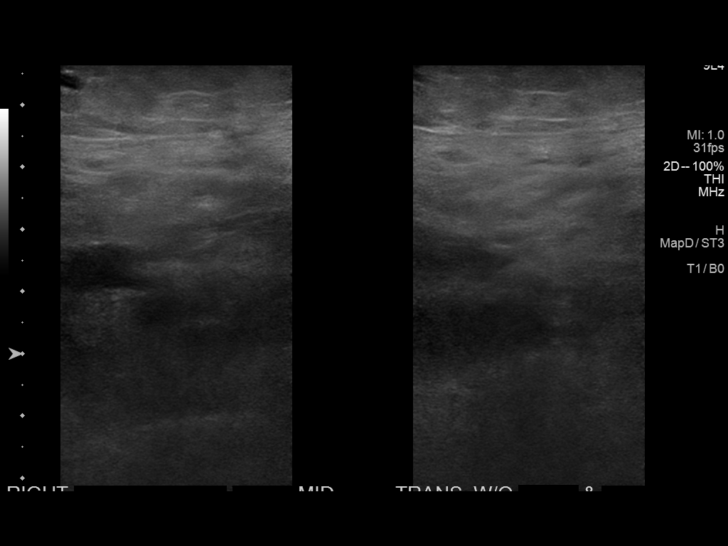
[im 38/72]
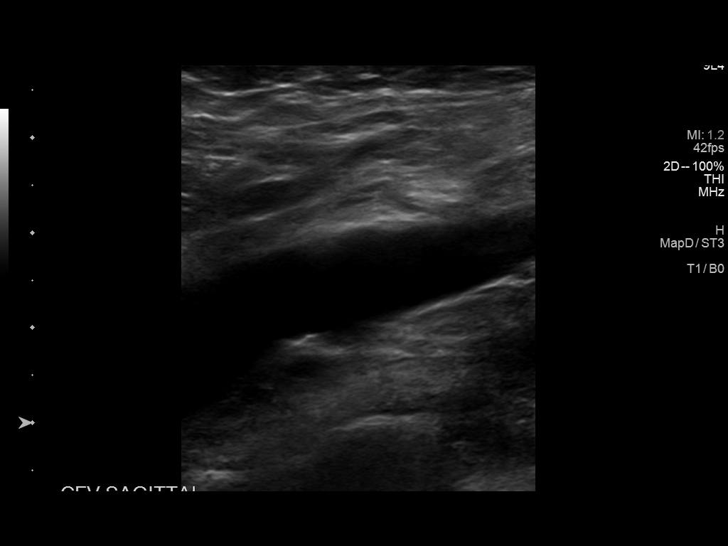
[im 41/72]
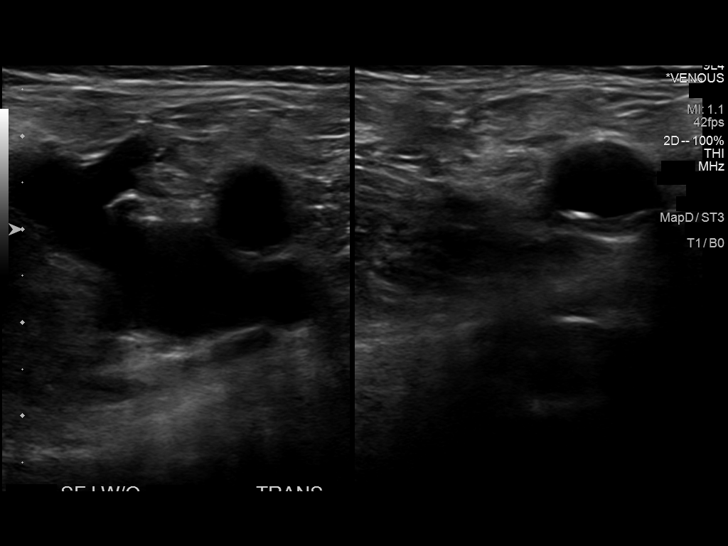
[im 47/72]
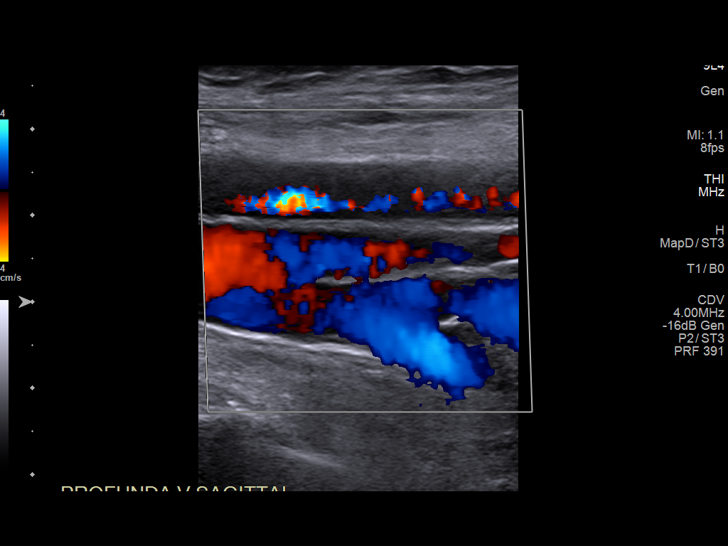
[im 53/72]
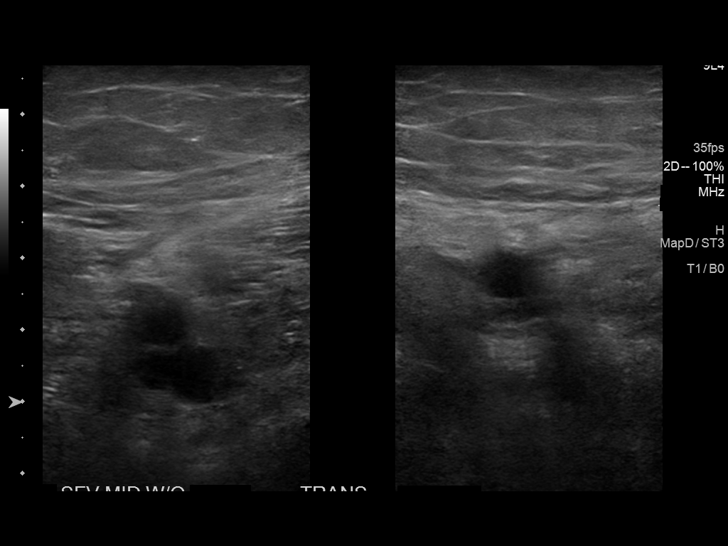
[im 59/72]
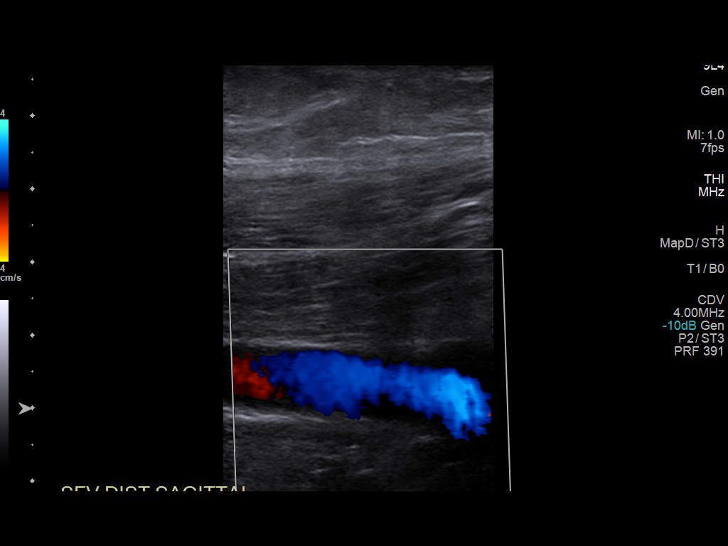
[im 65/72]
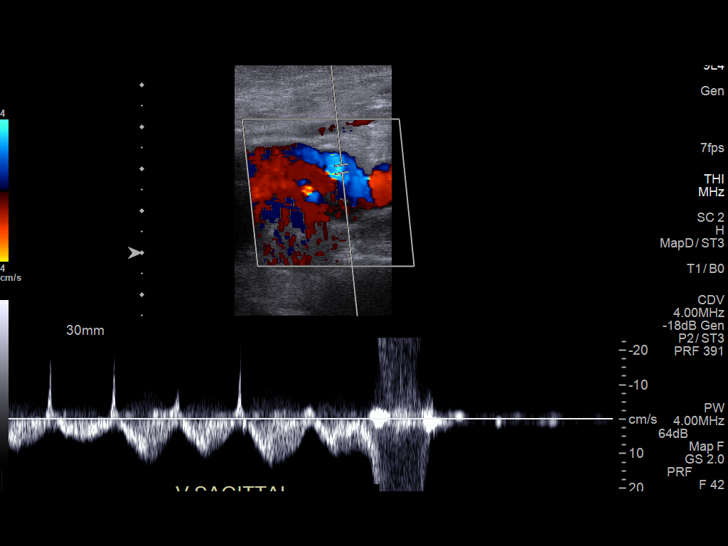
[im 72/72]
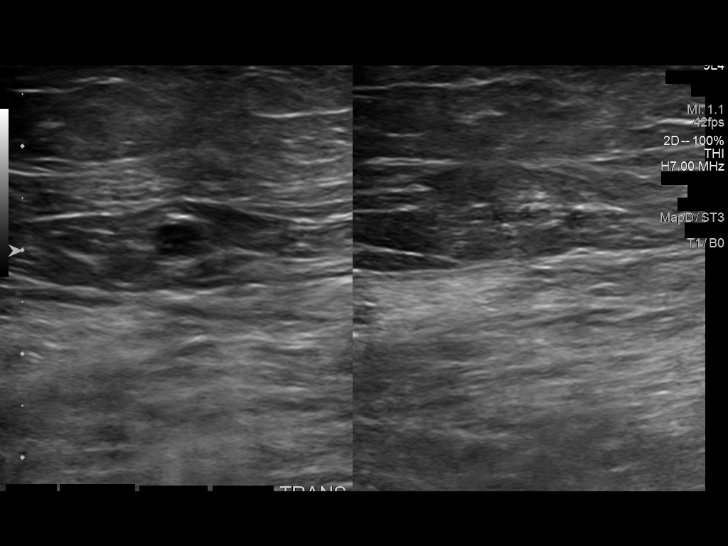

[13 of 24 positions shown; findings below may reference images not displayed]

FINDINGS: RIGHT LOWER EXTREMITY

Common Femoral Vein: No evidence of thrombus. Normal
compressibility, respiratory phasicity and response to augmentation.

Saphenofemoral Junction: No evidence of thrombus. Normal
compressibility and flow on color Doppler imaging.

Profunda Femoral Vein: No evidence of thrombus. Normal
compressibility and flow on color Doppler imaging.

Femoral Vein: No evidence of thrombus. Normal compressibility,
respiratory phasicity and response to augmentation.

Popliteal Vein: No evidence of thrombus. Normal compressibility,
respiratory phasicity and response to augmentation.

Calf Veins: No evidence of thrombus. Normal compressibility and flow
on color Doppler imaging.

Venous Reflux:  None.

Other Findings:  None.

LEFT LOWER EXTREMITY

Common Femoral Vein: No evidence of thrombus. Normal
compressibility, respiratory phasicity and response to augmentation.

Saphenofemoral Junction: No evidence of thrombus. Normal
compressibility and flow on color Doppler imaging.

Profunda Femoral Vein: No evidence of thrombus. Normal
compressibility and flow on color Doppler imaging.

Femoral Vein: No evidence of thrombus. Normal compressibility,
respiratory phasicity and response to augmentation.

Popliteal Vein: No evidence of thrombus. Normal compressibility,
respiratory phasicity and response to augmentation.

Calf Veins: No evidence of thrombus. Normal compressibility and flow
on color Doppler imaging.

Venous Reflux:  None.

Other Findings:  None.
IMPRESSION: No evidence of deep venous thrombosis.
